# Patient Record
Sex: Female | Born: 2000 | Race: White | Hispanic: No | Marital: Single | Smoking: Never smoker
Health system: Southern US, Community
[De-identification: ages and names within clinical notes are randomized; demographics above are authoritative.]

## PROBLEM LIST (undated history)

## (undated) DIAGNOSIS — E343 Short stature due to endocrine disorder: Secondary | ICD-10-CM

## (undated) DIAGNOSIS — F909 Attention-deficit hyperactivity disorder, unspecified type: Secondary | ICD-10-CM

## (undated) DIAGNOSIS — Z789 Other specified health status: Secondary | ICD-10-CM

## (undated) DIAGNOSIS — Z0282 Encounter for adoption services: Secondary | ICD-10-CM

## (undated) HISTORY — DX: Other specified health status: Z78.9

## (undated) HISTORY — DX: Short stature due to endocrine disorder: E34.3

## (undated) HISTORY — DX: Encounter for adoption services: Z02.82

---
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
## 2023-04-15 ENCOUNTER — Ambulatory Visit: Admitting: Obstetrics & Gynecology

## 2023-04-15 ENCOUNTER — Encounter: Admitting: Obstetrics and Gynecology

## 2023-04-15 VITALS — BP 122/79 | HR 76 | Ht 59.0 in | Wt 106.0 lb

## 2023-04-15 DIAGNOSIS — Z3046 Encounter for surveillance of implantable subdermal contraceptive: Secondary | ICD-10-CM

## 2023-04-15 MED ORDER — ETONOGESTREL 68 MG ~~LOC~~ IMPL
68.0000 mg | DRUG_IMPLANT | Freq: Once | SUBCUTANEOUS | Status: AC
  Administered 2023-04-15: 68 mg via SUBCUTANEOUS

## 2023-04-15 NOTE — Progress Notes (Signed)
   NEXPLANON REMOVAL AND RE-INSERTION Patient name: Makayla Dougherty MRN [id number]  Date of birth: Sep 26, 2000 Subjective Findings:   Makayla Dougherty is a 23 y.o. G0P0000  female being seen today for Nexplanon removal and re-insertion. Her Nexplanon was placed 02/2020  So far she does not have a period with this form of contraception.  She denies vaginal discharge, itching or irritation.  She reports no acute complaints or concerns.   Contraceptive options were reviewed with patient and she desires to proceed with Nexplanon  No LMP recorded. Pap not indicated  Risks/benefits/side effects of Nexplanon have been discussed and her questions have been answered.  Specifically, a failure rate of 01/998 has been reported, with an increased failure rate if pt takes antiseizure medicaitons.  She is aware of the common side effect of irregular bleeding, which the incidence of decreases over time. Signed copy of informed consent in chart.     02/01/2020    4:44 PM  Depression screen PHQ 2/9  Decreased Interest 0  Down, Depressed, Hopeless 0  PHQ - 2 Score 0  Altered sleeping 0  Tired, decreased energy 0  Change in appetite 0  Feeling bad or failure about yourself  0  Trouble concentrating 0  Moving slowly or fidgety/restless 0  Suicidal thoughts 0  PHQ-9 Score 0    Pertinent History Reviewed:   Reviewed past medical,surgical, social, obstetrical and family history.  Reviewed problem list, medications and allergies. Objective Findings & Procedure:    Vitals:   04/15/23 1049  BP: 122/79  Pulse: 76  Weight: 106 lb (48.1 kg)  Height: 4\' 11"  (1.499 m)  Body mass index is 21.41 kg/m.  No results found for this or any previous visit (from the past 24 hours).   Time out was performed.  Nexplanon site identified.  Area prepped in usual sterile fashon. Two cc's of 2% lidocaine was used to anesthetize the area. A small stab incision was made right beside the implant on the distal portion.   The Nexplanon rod was grasped using hemostats and removed intact without difficulty.  A new Nexplanon was inserted approximately 10cm from the medial epicondyle and 3-5cm posterior to the sulcus per manufacturer's recommendations without difficulty.  Steri-strips and a pressure bandage was applied.  There was less than 3 cc blood loss. There were no complications.  The patient tolerated the procedure well. Assessment & Plan:   1) Nexplanon removal & re-insertion She was instructed to keep the area clean and dry, remove pressure bandage in 24 hours, and keep insertion site covered with the steri-strips for 3-5 days.  She was given a card indicating date Nexplanon was inserted and date it needs to be removed.  Follow-up PRN problems.  2) Preventive screening -reviewed preventive screening/pap to start next year  No orders of the defined types were placed in this encounter.   Follow-up: Return in about 1 year (around 04/14/2024) for Annual.  Makayla Hidalgo, DO Attending Obstetrician & Gynecologist, [facility]
